# Patient Record
Sex: Female | Born: 2005 | Race: White | Hispanic: Yes | Marital: Single | Smoking: Never smoker
Health system: Southern US, Community
[De-identification: ages and names within clinical notes are randomized; demographics above are authoritative.]

## PROBLEM LIST (undated history)

## (undated) ENCOUNTER — Inpatient Hospital Stay (HOSPITAL_COMMUNITY): Payer: Self-pay

## (undated) DIAGNOSIS — Z789 Other specified health status: Secondary | ICD-10-CM

## (undated) DIAGNOSIS — D649 Anemia, unspecified: Secondary | ICD-10-CM

## (undated) HISTORY — DX: Other specified health status: Z78.9

## (undated) HISTORY — PX: NO PAST SURGERIES: SHX2092

---
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
## 2024-01-03 LAB — AFP, SERUM, OPEN SPINA BIFIDA
AFP MoM: 0.77
AFP Value: 27.9 ng/mL
Gest. Age on Collection Date: 16 wk
Maternal Age At EDD: 19.5 a
OSBR Risk 1 IN: 10000
Test Results:: NEGATIVE
Weight: 130 [lb_av]

## 2024-01-04 ENCOUNTER — Ambulatory Visit: Payer: Self-pay | Admitting: Obstetrics and Gynecology

## 2024-01-28 ENCOUNTER — Ambulatory Visit (INDEPENDENT_AMBULATORY_CARE_PROVIDER_SITE_OTHER): Admitting: Obstetrics and Gynecology

## 2024-01-28 ENCOUNTER — Encounter: Admitting: Physician Assistant

## 2024-01-28 VITALS — BP 112/71 | HR 61 | Wt 131.0 lb

## 2024-01-28 DIAGNOSIS — B3731 Acute candidiasis of vulva and vagina: Secondary | ICD-10-CM

## 2024-01-28 DIAGNOSIS — Z3482 Encounter for supervision of other normal pregnancy, second trimester: Secondary | ICD-10-CM | POA: Diagnosis not present

## 2024-01-28 DIAGNOSIS — Z3A20 20 weeks gestation of pregnancy: Secondary | ICD-10-CM | POA: Diagnosis not present

## 2024-01-28 DIAGNOSIS — Z3480 Encounter for supervision of other normal pregnancy, unspecified trimester: Secondary | ICD-10-CM

## 2024-01-28 MED ORDER — MICONAZOLE NITRATE 2 % VA CREA
1.0000 | TOPICAL_CREAM | Freq: Every day | VAGINAL | 0 refills | Status: AC
Start: 2024-01-28 — End: 2024-02-04

## 2024-01-28 NOTE — Progress Notes (Signed)
 Pt states she was recently tx for yeast, still having symptoms.

## 2024-01-28 NOTE — Progress Notes (Signed)
   PRENATAL VISIT NOTE  Subjective:  Sophia Green is a 18 y.o. G2P1001 at [redacted]w[redacted]d being seen today for ongoing prenatal care.  She is currently monitored for the following issues for this low-risk pregnancy and has Supervision of other normal pregnancy, antepartum and Short interval between pregnancies affecting pregnancy, antepartum on their problem list.  Patient reports doing well but was treated for yeast infection and symptoms improved but has returned with continued thick white vaginal discharge. Requesting refill on yeast meds.  Contractions: Not present. Vag. Bleeding: None.  Movement: Present. Denies leaking of fluid.   The following portions of the patient's history were reviewed and updated as appropriate: allergies, current medications, past family history, past medical history, past social history, past surgical history and problem list.   Objective:   Vitals:   01/28/24 1122  BP: 112/71  Pulse: 61  Weight: 131 lb (59.4 kg)   Body mass index is 25.58 kg/m. Total weight gain: -9 lb (-4.082 kg)   Fetal Status: Fetal Heart Rate (bpm): 140   Movement: Present     General:  Alert, oriented and cooperative. Patient is in no acute distress.  Skin: Skin is warm and dry. No rash noted.   Cardiovascular: Normal heart rate noted  Respiratory: Normal respiratory effort, no problems with respiration noted  Abdomen: Soft, gravid, appropriate for gestational age.  Pain/Pressure: Absent     Pelvic: Cervical exam deferred        Extremities: Normal range of motion.     Mental Status: Normal mood and affect. Normal behavior. Normal judgment and thought content.   Assessment and Plan:  Pregnancy: G2P1001 at [redacted]w[redacted]d 1. Supervision of other normal pregnancy, antepartum (Primary) Anticipatory guidance Anatomy ultrasound 10/10 Neg AFP  2. Vaginal yeast infection Refill given - miconazole  (MONISTAT  7) 2 % vaginal cream; Place 1 Applicatorful vaginally at bedtime for 7 days.   Dispense: 315 g; Refill: 0   Preterm labor symptoms and general obstetric precautions including but not limited to vaginal bleeding, contractions, leaking of fluid and fetal movement were reviewed in detail with the patient. Please refer to After Visit Summary for other counseling recommendations.   No follow-ups on file.  Future Appointments  Date Time Provider Department Center  02/12/2024  8:00 AM [facility] PROVIDER 1 [facility]  02/12/2024  8:30 AM [facility] US1 [facility]    Rollo ONEIDA Bring, MD

## 2024-02-12 ENCOUNTER — Other Ambulatory Visit: Payer: Self-pay | Admitting: Obstetrics and Gynecology

## 2024-02-12 ENCOUNTER — Ambulatory Visit: Attending: Obstetrics and Gynecology

## 2024-02-12 ENCOUNTER — Inpatient Hospital Stay (HOSPITAL_COMMUNITY)
Admission: AD | Admit: 2024-02-12 | Discharge: 2024-02-13 | Disposition: A | Discharge status: Home / Self Care | Attending: Obstetrics & Gynecology | Admitting: Obstetrics & Gynecology

## 2024-02-12 ENCOUNTER — Ambulatory Visit: Admitting: Maternal & Fetal Medicine

## 2024-02-12 VITALS — BP 111/70

## 2024-02-12 DIAGNOSIS — Z3689 Encounter for other specified antenatal screening: Secondary | ICD-10-CM | POA: Insufficient documentation

## 2024-02-12 DIAGNOSIS — R1030 Lower abdominal pain, unspecified: Secondary | ICD-10-CM | POA: Diagnosis not present

## 2024-02-12 DIAGNOSIS — Z3480 Encounter for supervision of other normal pregnancy, unspecified trimester: Secondary | ICD-10-CM

## 2024-02-12 DIAGNOSIS — O26852 Spotting complicating pregnancy, second trimester: Secondary | ICD-10-CM | POA: Diagnosis not present

## 2024-02-12 DIAGNOSIS — O99891 Other specified diseases and conditions complicating pregnancy: Secondary | ICD-10-CM | POA: Diagnosis not present

## 2024-02-12 DIAGNOSIS — N920 Excessive and frequent menstruation with regular cycle: Secondary | ICD-10-CM

## 2024-02-12 DIAGNOSIS — O09899 Supervision of other high risk pregnancies, unspecified trimester: Secondary | ICD-10-CM | POA: Diagnosis present

## 2024-02-12 DIAGNOSIS — R1031 Right lower quadrant pain: Secondary | ICD-10-CM | POA: Diagnosis not present

## 2024-02-12 DIAGNOSIS — O09892 Supervision of other high risk pregnancies, second trimester: Secondary | ICD-10-CM

## 2024-02-12 DIAGNOSIS — O26892 Other specified pregnancy related conditions, second trimester: Secondary | ICD-10-CM | POA: Insufficient documentation

## 2024-02-12 DIAGNOSIS — Z3A22 22 weeks gestation of pregnancy: Secondary | ICD-10-CM

## 2024-02-12 DIAGNOSIS — Z711 Person with feared health complaint in whom no diagnosis is made: Secondary | ICD-10-CM

## 2024-02-12 DIAGNOSIS — O3680X0 Pregnancy with inconclusive fetal viability, not applicable or unspecified: Secondary | ICD-10-CM

## 2024-02-12 DIAGNOSIS — Z363 Encounter for antenatal screening for malformations: Secondary | ICD-10-CM | POA: Diagnosis not present

## 2024-02-12 DIAGNOSIS — R1032 Left lower quadrant pain: Secondary | ICD-10-CM | POA: Diagnosis not present

## 2024-02-12 NOTE — MAU Note (Signed)
..  Sophia Green is a 18 y.o. at [redacted]w[redacted]d here in MAU reporting: cramping that began today around 3 pm and spotting that began around midday. The bleeding is only when she wipes and some spots on her underwear.  Last intercourse: 2 weeks ago Pain score: 6/10 Vitals:   02/12/24 2316  BP: 113/67  Pulse: 80  Resp: 15  Temp: 98.4 F (36.9 C)  SpO2: 100%     FHT:140 Lab orders placed from triage:  UA

## 2024-02-12 NOTE — Progress Notes (Signed)
 Patient information  Patient Name: Sophia Green  Patient MRN:   [id number]  Referring practice: [facility] Referring Provider: [facility] [facility]  Problem List   Patient Active Problem List   Diagnosis Date Noted   Supervision of other normal pregnancy, antepartum 11/17/2023   Short interval between pregnancies affecting pregnancy, antepartum 11/17/2023    Maternal Fetal Medicine Consult Sophia Green is a 18 y.o. G2P1001 at [redacted]w[redacted]d here for ultrasound and consultation. She had low risk aneuploidy screening of a female fetus. Carrier screening was Negative for the basic screening (SMA, alpha-thal, beta-thal, and cystic fibroisis. Maternal serum AFP was negative. She has no acute concerns.   Today we focused on the following:   Short interval pregnancy: I discussed the clinical implications of this diagnosis.  It appears that there will be approximately 16 months between conception of the previous pregnancy and delivery of this pregnancy.  This has been associated with an increased risk of preterm birth although the exact risk is unknown.  No future ultrasounds or interventions are required at this time.  The patient will monitor for signs and symptoms of preterm labor and notify her OB provider with any concerns.   Sonographic findings Single intrauterine pregnancy at 22w 1d. Fetal cardiac activity:  Observed and appears normal. Presentation: Transverse, head to maternal right. The anatomic structures that were well seen appear normal without evidence of soft markers. The anatomic survey is complete.  Fetal biometry shows the estimated fetal weight at the 21 percentile. Amniotic fluid: Within normal limits.  MVP: 5.55 cm. Placenta: Posterior Fundal. Adnexa: No adnexal mass visualized. Cervical length: 3.2 cm.  There are limitations of prenatal ultrasound such as the inability to detect certain abnormalities due to poor visualization. Various factors such as fetal  position, gestational age and maternal body habitus may increase the difficulty in visualizing the fetal anatomy.    Recommendations -EDD should be 06/16/2024 based on  LMP  (09/10/23). -No further ultrasounds are recommended at this time based on the current indications. If future indications arise (e.g. size/date discrepancy on fundal height, gestational diabetes or hypertension) and an ultrasound is to be desired at our MFM office, please send a referral.   Review of Systems: A review of systems was performed and was negative except per HPI   Past Obstetrical History:  OB History  Gravida Para Term Preterm AB Living  2 1 1  0 0 1  SAB IAB Ectopic Multiple Live Births  0 0 0 0 1    # Outcome Date GA Lbr Len/2nd Weight Sex Type Anes PTL Lv  2 Current           1 Term 12/21/22 [redacted]w[redacted]d 09:00 / 02:44 8 lb 1.8 oz (3.68 kg) M Vag-Spont EPI, Local  LIV     Birth Comments: wdl     Past Medical History:  Past Medical History:  Diagnosis Date   Anemia      Past Surgical History:    Past Surgical History:  Procedure Laterality Date   NO PAST SURGERIES       Home Medications:   Current Outpatient Medications on File Prior to Visit  Medication Sig Dispense Refill   ferrous sulfate  325 (65 FE) MG tablet Take 1 tablet (325 mg total) by mouth every other day. 30 tablet 3   Prenatal Vit-Fe Fumarate-FA (PRENATAL VITAMINS) 28-0.8 MG TABS Take 1 tablet by mouth daily. 30 tablet 11   No current facility-administered medications on file prior to visit.  Allergies:   No Known Allergies   Physical Exam:   Vitals:   02/12/24 0814  BP: 111/70   Sitting comfortably on the sonogram table Nonlabored breathing Normal rate and rhythm Abdomen is nontender  Thank you for the opportunity to be involved with this patient's care. Please let us  know if we can be of any further assistance.   30 minutes of time was spent reviewing the patient's chart including labs, imaging and documentation.   At least 50% of this time was spent with direct patient care discussing the diagnosis, management and prognosis of her care.  Delora Smaller MFM, [place]   02/12/2024  9:18 AM

## 2024-02-13 ENCOUNTER — Encounter (HOSPITAL_COMMUNITY): Payer: Self-pay | Admitting: Obstetrics & Gynecology

## 2024-02-13 DIAGNOSIS — N920 Excessive and frequent menstruation with regular cycle: Secondary | ICD-10-CM

## 2024-02-13 DIAGNOSIS — R1031 Right lower quadrant pain: Secondary | ICD-10-CM

## 2024-02-13 DIAGNOSIS — R1032 Left lower quadrant pain: Secondary | ICD-10-CM

## 2024-02-13 DIAGNOSIS — Z3A22 22 weeks gestation of pregnancy: Secondary | ICD-10-CM

## 2024-02-13 DIAGNOSIS — O99891 Other specified diseases and conditions complicating pregnancy: Secondary | ICD-10-CM

## 2024-02-13 LAB — URINALYSIS, ROUTINE W REFLEX MICROSCOPIC
Bacteria, UA: NONE SEEN
Bilirubin Urine: NEGATIVE
Glucose, UA: NEGATIVE mg/dL
Hgb urine dipstick: NEGATIVE
Ketones, ur: NEGATIVE mg/dL
Nitrite: NEGATIVE
Protein, ur: NEGATIVE mg/dL
Specific Gravity, Urine: 1.029 (ref 1.005–1.030)
pH: 5 (ref 5.0–8.0)

## 2024-02-13 LAB — WET PREP, GENITAL
Clue Cells Wet Prep HPF POC: NONE SEEN
Sperm: NONE SEEN
Trich, Wet Prep: NONE SEEN
WBC, Wet Prep HPF POC: >=10 — AB (ref <10)
Yeast Wet Prep HPF POC: NONE SEEN

## 2024-02-13 NOTE — MAU Provider Note (Signed)
 History     CSN: [id number]  Arrival date and time: 02/12/24 2240   Event Date/Time   First Provider Initiated Contact with Patient 02/13/24 0052      Chief Complaint  Patient presents with   Abdominal Pain   Vaginal Bleeding   Sophia Green , a  18 y.o. G2P1001 at [redacted]w[redacted]d presents to MAU with complaints of spotting and lower abdominal cramping. Patient reports spotting 2-3 days ago. She reports that it was only a little bit with wiping. Denies bright red bleeding and passing clots. Denies bleeding or spotting at this time. She also reports some intermittent lower abdominal pain that started today. She currently rates pain as a 6/10 and denies attempting to relieve symptoms. Denies worsening or alleviating symptoms. She also reports a yellow vaginal discharge. Denies a odor or itching/burning and urinary symptoms. Endorses positive fetal movement.          OB History     Gravida  2   Para  1   Term  1   Preterm  0   AB  0   Living  1      SAB  0   IAB  0   Ectopic  0   Multiple  0   Live Births  1           Past Medical History:  Diagnosis Date   Anemia     Past Surgical History:  Procedure Laterality Date   NO PAST SURGERIES      Family History  Problem Relation Age of Onset   Healthy Mother    Healthy Father    Diabetes Maternal Aunt    Diabetes Maternal Grandmother    Asthma Neg Hx    Cancer Neg Hx    Heart disease Neg Hx    Hypertension Neg Hx     Social History   Tobacco Use   Smoking status: Never   Smokeless tobacco: Never  Vaping Use   Vaping status: Never Used  Substance Use Topics   Alcohol use: Never   Drug use: Never    Allergies: No Known Allergies  Medications Prior to Admission  Medication Sig Dispense Refill Last Dose/Taking   Prenatal Vit-Fe Fumarate-FA (PRENATAL VITAMINS) 28-0.8 MG TABS Take 1 tablet by mouth daily. 30 tablet 11 02/12/2024   ferrous sulfate  325 (65 FE) MG tablet Take 1 tablet  (325 mg total) by mouth every other day. (Patient not taking: Reported on 02/13/2024) 30 tablet 3 Not Taking    Review of Systems  Constitutional:  Negative for chills, fatigue and fever.  Eyes:  Negative for pain and visual disturbance.  Respiratory:  Negative for apnea, shortness of breath and wheezing.   Cardiovascular:  Negative for chest pain and palpitations.  Gastrointestinal:  Positive for abdominal pain. Negative for constipation, diarrhea, nausea and vomiting.  Genitourinary:  Positive for vaginal discharge. Negative for difficulty urinating, dysuria, pelvic pain, vaginal bleeding and vaginal pain.  Musculoskeletal:  Negative for back pain.  Neurological:  Negative for seizures, weakness and headaches.  Psychiatric/Behavioral:  Negative for suicidal ideas.    Physical Exam   Blood pressure 113/67, pulse 80, temperature 98.4 F (36.9 C), temperature source Oral, resp. rate 15, height 5' (1.524 m), weight 60.9 kg, last menstrual period 09/10/2023, SpO2 100%, currently breastfeeding.  Physical Exam Vitals and nursing note reviewed. Exam conducted with a chaperone present BENNIE. [place] RN).  Constitutional:      General: She is not in acute distress.  Appearance: Normal appearance.  HENT:     Head: Normocephalic.  Pulmonary:     Effort: Pulmonary effort is normal.  Abdominal:     Tenderness: There is no abdominal tenderness. There is no guarding.  Genitourinary:    Vagina: Vaginal discharge present.     Cervix: Discharge present.     Uterus: Not tender.      Comments: Frothy yellow discharge noted.  Musculoskeletal:     Cervical back: Normal range of motion.  Skin:    General: Skin is warm and dry.  Neurological:     Mental Status: She is alert and oriented to person, place, and time.  Psychiatric:        Mood and Affect: Mood normal.    FHT obtained in triage    MAU Course  Procedures Orders Placed This Encounter  Procedures   Wet prep, genital    Urinalysis, Routine w reflex microscopic -Urine, Clean Catch   Results for orders placed or performed during the hospital encounter of 02/12/24 (from the past 24 hours)  Urinalysis, Routine w reflex microscopic -Urine, Clean Catch     Status: Abnormal   Collection Time: 02/12/24 11:10 PM  Result Value Ref Range   Color, Urine YELLOW YELLOW   APPearance HAZY (A) CLEAR   Specific Gravity, Urine 1.029 1.005 - 1.030   pH 5.0 5.0 - 8.0   Glucose, UA NEGATIVE NEGATIVE mg/dL   Hgb urine dipstick NEGATIVE NEGATIVE   Bilirubin Urine NEGATIVE NEGATIVE   Ketones, ur NEGATIVE NEGATIVE mg/dL   Protein, ur NEGATIVE NEGATIVE mg/dL   Nitrite NEGATIVE NEGATIVE   Leukocytes,Ua SMALL (A) NEGATIVE   RBC / HPF 0-5 0 - 5 RBC/hpf   WBC, UA 0-5 0 - 5 WBC/hpf   Bacteria, UA NONE SEEN NONE SEEN   Squamous Epithelial / HPF 6-10 0 - 5 /HPF   Mucus PRESENT     MDM - Offered patient pain management and patient declined. Heat packs given.  - Through conversation patient also denies drinking any water in the last few days. Discussed the importance of drinking water.  - UA notable for small leuks, and hazy in appearance, otherwise normal. Low suspicion for UTI in pregnancy.  - PO hydration encouraged.  - Wet prep normal  - No signs of spotting or bleeding noted on exam. Cervix appears closed, low suspicion for PTL.  - plan for discharge.   Assessment and Plan   1. Pregnancy related bilateral lower abdominal cramping, antepartum   2. Spotting   3. [redacted] weeks gestation of pregnancy   4. Physically well but worried    - Reviewed that cramping and spotting can be a normal discomfort of pregnancy.  - Reviewed worsening signs and symptoms and when to return to MAU.  - Strong recommendation to drink water and discussed comfort measures that are safe in pregnancy.  - Patient discharged home in stable condition and may return to MAU as needed.    Claris CHRISTELLA Cedar, MSN CNM  02/13/2024, 12:52 AM

## 2024-02-15 LAB — GC/CHLAMYDIA PROBE AMP (~~LOC~~) NOT AT ARMC
Chlamydia: NEGATIVE
Comment: NEGATIVE
Comment: NORMAL
Neisseria Gonorrhea: NEGATIVE

## 2024-02-16 ENCOUNTER — Ambulatory Visit: Payer: Self-pay | Admitting: Obstetrics and Gynecology

## 2024-02-16 DIAGNOSIS — Z3480 Encounter for supervision of other normal pregnancy, unspecified trimester: Secondary | ICD-10-CM

## 2024-02-24 ENCOUNTER — Encounter (HOSPITAL_COMMUNITY): Payer: Self-pay | Admitting: Obstetrics and Gynecology

## 2024-02-24 ENCOUNTER — Inpatient Hospital Stay (HOSPITAL_COMMUNITY)

## 2024-02-24 ENCOUNTER — Inpatient Hospital Stay (HOSPITAL_COMMUNITY)
Admission: AD | Admit: 2024-02-24 | Discharge: 2024-02-24 | Disposition: A | Discharge status: Home / Self Care | Attending: Obstetrics and Gynecology | Admitting: Obstetrics and Gynecology

## 2024-02-24 DIAGNOSIS — O26892 Other specified pregnancy related conditions, second trimester: Secondary | ICD-10-CM | POA: Diagnosis present

## 2024-02-24 DIAGNOSIS — R1011 Right upper quadrant pain: Secondary | ICD-10-CM | POA: Insufficient documentation

## 2024-02-24 DIAGNOSIS — O212 Late vomiting of pregnancy: Secondary | ICD-10-CM | POA: Insufficient documentation

## 2024-02-24 DIAGNOSIS — O26899 Other specified pregnancy related conditions, unspecified trimester: Secondary | ICD-10-CM

## 2024-02-24 DIAGNOSIS — Z3A23 23 weeks gestation of pregnancy: Secondary | ICD-10-CM

## 2024-02-24 DIAGNOSIS — R109 Unspecified abdominal pain: Secondary | ICD-10-CM

## 2024-02-24 DIAGNOSIS — M549 Dorsalgia, unspecified: Secondary | ICD-10-CM | POA: Diagnosis not present

## 2024-02-24 DIAGNOSIS — O219 Vomiting of pregnancy, unspecified: Secondary | ICD-10-CM

## 2024-02-24 LAB — COMPREHENSIVE METABOLIC PANEL WITH GFR
ALT: 13 U/L (ref 0–44)
AST: 18 U/L (ref 15–41)
Albumin: 2.9 g/dL — ABNORMAL LOW (ref 3.5–5.0)
Alkaline Phosphatase: 69 U/L (ref 38–126)
Anion gap: 8 (ref 5–15)
BUN: 6 mg/dL (ref 6–20)
CO2: 20 mmol/L — ABNORMAL LOW (ref 22–32)
Calcium: 8.4 mg/dL — ABNORMAL LOW (ref 8.9–10.3)
Chloride: 108 mmol/L (ref 98–111)
Creatinine, Ser: 0.47 mg/dL (ref 0.44–1.00)
GFR, Estimated: >60 mL/min (ref >60)
Glucose, Bld: 92 mg/dL (ref 70–99)
Potassium: 3.7 mmol/L (ref 3.5–5.1)
Sodium: 136 mmol/L (ref 135–145)
Total Bilirubin: 0.3 mg/dL (ref 0.0–1.2)
Total Protein: 6 g/dL — ABNORMAL LOW (ref 6.5–8.1)

## 2024-02-24 LAB — CBC
HCT: 31.5 % — ABNORMAL LOW (ref 36.0–46.0)
Hemoglobin: 10.8 g/dL — ABNORMAL LOW (ref 12.0–15.0)
MCH: 30.3 pg (ref 26.0–34.0)
MCHC: 34.3 g/dL (ref 30.0–36.0)
MCV: 88.2 fL (ref 80.0–100.0)
Platelets: 162 K/uL (ref 150–400)
RBC: 3.57 MIL/uL — ABNORMAL LOW (ref 3.87–5.11)
RDW: 13.9 % (ref 11.5–15.5)
WBC: 8.4 K/uL (ref 4.0–10.5)
nRBC: 0 % (ref 0.0–0.2)

## 2024-02-24 LAB — URINALYSIS, ROUTINE W REFLEX MICROSCOPIC
Bacteria, UA: NONE SEEN
Bilirubin Urine: NEGATIVE
Glucose, UA: NEGATIVE mg/dL
Hgb urine dipstick: NEGATIVE
Ketones, ur: NEGATIVE mg/dL
Nitrite: NEGATIVE
Protein, ur: NEGATIVE mg/dL
Specific Gravity, Urine: 1.017 (ref 1.005–1.030)
pH: 5 (ref 5.0–8.0)

## 2024-02-24 LAB — LIPASE, BLOOD: Lipase: 23 U/L (ref 11–51)

## 2024-02-24 MED ORDER — ONDANSETRON 4 MG PO TBDP
8.0000 mg | ORAL_TABLET | Freq: Once | ORAL | Status: AC
  Administered 2024-02-24: 8 mg via ORAL
  Filled 2024-02-24: qty 2

## 2024-02-24 MED ORDER — METOCLOPRAMIDE HCL 5 MG/ML IJ SOLN
10.0000 mg | Freq: Once | INTRAMUSCULAR | Status: AC
  Administered 2024-02-24: 10 mg via INTRAVENOUS
  Filled 2024-02-24: qty 2

## 2024-02-24 MED ORDER — LIDOCAINE 5 % EX PTCH
1.0000 | MEDICATED_PATCH | CUTANEOUS | Status: DC
  Administered 2024-02-24: 1 via TRANSDERMAL
  Filled 2024-02-24 (×2): qty 1

## 2024-02-24 MED ORDER — FAMOTIDINE 20 MG PO TABS: 20.0000 mg | ORAL_TABLET | Freq: Two times a day (BID) | ORAL | 0 refills | Status: AC | PRN

## 2024-02-24 MED ORDER — METOCLOPRAMIDE HCL 10 MG PO TABS: 10.0000 mg | ORAL_TABLET | Freq: Four times a day (QID) | ORAL | 0 refills | Status: AC | PRN

## 2024-02-24 MED ORDER — ACETAMINOPHEN 500 MG PO TABS
1000.0000 mg | ORAL_TABLET | Freq: Once | ORAL | Status: AC
  Administered 2024-02-24: 1000 mg via ORAL
  Filled 2024-02-24: qty 2

## 2024-02-24 MED ORDER — FAMOTIDINE IN NACL 20-0.9 MG/50ML-% IV SOLN
20.0000 mg | Freq: Once | INTRAVENOUS | Status: AC
  Administered 2024-02-24: 20 mg via INTRAVENOUS
  Filled 2024-02-24: qty 50

## 2024-02-24 MED ORDER — ALUM & MAG HYDROXIDE-SIMETH 200-200-20 MG/5ML PO SUSP
30.0000 mL | Freq: Once | ORAL | Status: AC
  Administered 2024-02-24: 30 mL via ORAL
  Filled 2024-02-24: qty 30

## 2024-02-24 MED ORDER — LACTATED RINGERS IV BOLUS
1000.0000 mL | Freq: Once | INTRAVENOUS | Status: AC
  Administered 2024-02-24: 1000 mL via INTRAVENOUS

## 2024-02-24 NOTE — MAU Note (Signed)
 Pt vomiting - states she does not want medications for nausea. MD made aware.

## 2024-02-24 NOTE — MAU Note (Addendum)
 RN called lab to inquire about Comprehensive Metabolic Panel and Lipase. Lab tech answered and informed RN that they are in process and should be resulted in 15-20 minutes.

## 2024-02-24 NOTE — MAU Note (Signed)
 Sophia Green is a 18 y.o. at [redacted]w[redacted]d here in MAU reporting having a hollow, dense pain in her abdomen. She motions to all over her stomach for pain location. Denies VB or LOF. Feels baby moving. Also having some lower back pain. The pain woke her up this am  LMP: na Onset of complaint: 0330 Pain score: 8 Vitals:   02/24/24 0437  BP: 120/77  Pulse: 73  Resp: 16  Temp: 97.6 F (36.4 C)  SpO2: 100%     FHT: 134  Lab orders placed from triage: u/a

## 2024-02-24 NOTE — MAU Provider Note (Addendum)
 Chief Complaint:  Abdominal Pain and Back Pain   HPI     Sophia Green is a 18 y.o. G2P1001 at [redacted]w[redacted]d who presents to maternity admissions reporting upper abdominal pain since 3:30am. Pain woke her up from sleep and has been present since then. Has not tried any medications for the pain. Currently 8/10 on the pain scale. Describes the pain as sharp and going into her back. Works at [employer], which she had for dinner. Pain is worse when taking a deep breath. Denies headache, changes in vision, trouble swallowing, acidic taste in mouth, nausea, vomiting, diarrhea, vaginal bleeding or discharge. She denies any surgical history.  Pregnancy Course: Pregnancy has been uncomplicated.   Past Medical History:  Diagnosis Date   Anemia    OB History  Gravida Para Term Preterm AB Living  2 1 1  0 0 1  SAB IAB Ectopic Multiple Live Births  0 0 0 0 1    # Outcome Date GA Lbr Len/2nd Weight Sex Type Anes PTL Lv  2 Current           1 Term 12/21/22 [redacted]w[redacted]d 09:00 / 02:44 3680 g M Vag-Spont EPI, Local  LIV     Birth Comments: wdl   Past Surgical History:  Procedure Laterality Date   NO PAST SURGERIES     Family History  Problem Relation Age of Onset   Healthy Mother    Healthy Father    Diabetes Maternal Aunt    Diabetes Maternal Grandmother    Asthma Neg Hx    Cancer Neg Hx    Heart disease Neg Hx    Hypertension Neg Hx    Social History   Tobacco Use   Smoking status: Never   Smokeless tobacco: Never  Vaping Use   Vaping status: Never Used  Substance Use Topics   Alcohol use: Never   Drug use: Never   No Known Allergies Medications Prior to Admission  Medication Sig Dispense Refill Last Dose/Taking   Prenatal Vit-Fe Fumarate-FA (PRENATAL VITAMINS) 28-0.8 MG TABS Take 1 tablet by mouth daily. 30 tablet 11 02/23/2024   ferrous sulfate  325 (65 FE) MG tablet Take 1 tablet (325 mg total) by mouth every other day. (Patient not taking: Reported on 02/13/2024) 30 tablet 3      I have reviewed patient's Past Medical Hx, Surgical Hx, Family Hx, Social Hx, medications and allergies.   ROS  Pertinent items noted in HPI and remainder of comprehensive ROS otherwise negative.   PHYSICAL EXAM  Patient Vitals for the past 24 hrs:  BP Temp Pulse Resp SpO2 Height Weight  02/24/24 0546 120/85 -- 78 -- -- -- --  02/24/24 0532 (!) 125/90 -- 97 -- 100 % -- --  02/24/24 0437 120/77 97.6 F (36.4 C) 73 16 100 % 5' 1 (1.549 m) 62.1 kg    Constitutional: Well-developed, well-nourished female in no acute distress.  Cardiovascular: Warm and well-perfused Respiratory: normal effort, no problems with respiration noted GI: Abd soft, non-distended, TTP in epigastric, LUQ and RUQ regions MS: Extremities nontender, no edema, normal ROM Neurologic: Alert and oriented x 4.  GU: no CVA tenderness Pelvic: deferred     Fetal Tracing: Baseline: 130 bpm Variability: moderate Accelerations: present Decelerations: absent Toco: flat   Labs: Results for orders placed or performed during the hospital encounter of 02/24/24 (from the past 24 hours)  Urinalysis, Routine w reflex microscopic -Urine, Clean Catch     Status: Abnormal   Collection Time: 02/24/24  5:00  AM  Result Value Ref Range   Color, Urine YELLOW YELLOW   APPearance HAZY (A) CLEAR   Specific Gravity, Urine 1.017 1.005 - 1.030   pH 5.0 5.0 - 8.0   Glucose, UA NEGATIVE NEGATIVE mg/dL   Hgb urine dipstick NEGATIVE NEGATIVE   Bilirubin Urine NEGATIVE NEGATIVE   Ketones, ur NEGATIVE NEGATIVE mg/dL   Protein, ur NEGATIVE NEGATIVE mg/dL   Nitrite NEGATIVE NEGATIVE   Leukocytes,Ua TRACE (A) NEGATIVE   RBC / HPF 0-5 0 - 5 RBC/hpf   WBC, UA 0-5 0 - 5 WBC/hpf   Bacteria, UA NONE SEEN NONE SEEN   Squamous Epithelial / HPF 6-10 0 - 5 /HPF   Mucus PRESENT   CBC     Status: Abnormal   Collection Time: 02/24/24  5:44 AM  Result Value Ref Range   WBC 8.4 4.0 - 10.5 K/uL   RBC 3.57 (L) 3.87 - 5.11 MIL/uL   Hemoglobin  10.8 (L) 12.0 - 15.0 g/dL   HCT 68.4 (L) 63.9 - 53.9 %   MCV 88.2 80.0 - 100.0 fL   MCH 30.3 26.0 - 34.0 pg   MCHC 34.3 30.0 - 36.0 g/dL   RDW 86.0 88.4 - 84.4 %   Platelets 162 150 - 400 K/uL   nRBC 0.0 0.0 - 0.2 %  Comprehensive metabolic panel with GFR     Status: Abnormal   Collection Time: 02/24/24  5:44 AM  Result Value Ref Range   Sodium 136 135 - 145 mmol/L   Potassium 3.7 3.5 - 5.1 mmol/L   Chloride 108 98 - 111 mmol/L   CO2 20 (L) 22 - 32 mmol/L   Glucose, Bld 92 70 - 99 mg/dL   BUN 6 6 - 20 mg/dL   Creatinine, Ser 9.52 0.44 - 1.00 mg/dL   Calcium 8.4 (L) 8.9 - 10.3 mg/dL   Total Protein 6.0 (L) 6.5 - 8.1 g/dL   Albumin 2.9 (L) 3.5 - 5.0 g/dL   AST 18 15 - 41 U/L   ALT 13 0 - 44 U/L   Alkaline Phosphatase 69 38 - 126 U/L   Total Bilirubin 0.3 0.0 - 1.2 mg/dL   GFR, Estimated >39 >39 mL/min   Anion gap 8 5 - 15  Lipase, blood     Status: None   Collection Time: 02/24/24  5:44 AM  Result Value Ref Range   Lipase 23 11 - 51 U/L    Imaging:  US  ABDOMEN LIMITED RUQ (LIVER/GB) Result Date: 02/24/2024 EXAM: Right Upper Quadrant Abdominal Ultrasound TECHNIQUE: Real-time ultrasonography of the right upper quadrant of the abdomen was performed. COMPARISON: None. CLINICAL HISTORY: [redacted] weeks gestation of pregnancy; RUQ pain. FINDINGS: LIVER: The liver demonstrates normal echogenicity. No intrahepatic biliary ductal dilatation. No mass. Portal vein appears patent with normal flow towards the liver. BILIARY SYSTEM: Gallbladder appears normal. No gallstones, sludge, or pericholecystic fluid. Negative sonographic Murphy sign. No gallbladder wall thickening. No bile duct dilatation. The common bile duct measures 2.7 mm. OTHER: No right upper quadrant ascites. IMPRESSION: 1. Unremarkable right upper quadrant ultrasound. Electronically signed by: Waddell Calk MD 02/24/2024 06:02 AM EDT RP Workstation: [id number]    MDM & MAU COURSE  MDM: Moderate  MAU Course: Orders Placed This  Encounter  Procedures   US  ABDOMEN LIMITED RUQ (LIVER/GB)   Urinalysis, Routine w reflex microscopic -Urine, Clean Catch   CBC   Comprehensive metabolic panel with GFR   Lipase, blood   Discharge patient Discharge disposition: 01-Home or  Self Care; Discharge patient date: 02/24/2024   Meds ordered this encounter  Medications   acetaminophen  (TYLENOL ) tablet 1,000 mg   lidocaine  (LIDODERM ) 5 % 1 patch   ondansetron  (ZOFRAN -ODT) disintegrating tablet 8 mg   acetaminophen  (TYLENOL ) tablet 1,000 mg   alum & mag hydroxide-simeth (MAALOX/MYLANTA) 200-200-20 MG/5ML suspension 30 mL   lactated ringers  bolus 1,000 mL   famotidine (PEPCID) IVPB 20 mg premix   metoCLOPramide (REGLAN) injection 10 mg   metoCLOPramide (REGLAN) 10 MG tablet    Sig: Take 1 tablet (10 mg total) by mouth every 6 (six) hours as needed.    Dispense:  30 tablet    Refill:  0    Supervising Provider:   PRATT, TANYA S [[id number]]   famotidine (PEPCID) 20 MG tablet    Sig: Take 1 tablet (20 mg total) by mouth 2 (two) times daily as needed for heartburn or indigestion.    Dispense:  60 tablet    Refill:  0    Supervising Provider:   PRATT, TANYA S [[id number]]   VSS. Exam showing abdominal tenderness in upper quadrants, concern for gallbladder or pancreas pathology. Will give patient tylenol  for pain management and proceed with labwork.  5:25am - Vomited within 20 minutes of receiving tylenol , declines antiemetics at this time. Believes she feels slightly better. 6a - RUQ negative for gallbladder or liver pathology. Discussed results with patient. Offered pain medication and antiemetics, declined. Would like lidocaine  patch for her back.  7a - Pain increased to 10/10 per patient. Lidocaine  patch helping with back pain. Open to tylenol  and zofran  at this time. Still waiting for lipase and CMP results. 7:30a - Lipase and CMP overall unremarkable. Will give PO medications and observe for improvement. Current differential includes  gastritis, foodborne illness, appendicitis. If no improvement with rest and medications, will consider IV medications and MRI abdomen for better visualization. Patient signed out to Olam Dalton NP at [phone].    Charlie Courts, MD  Family Medicine - Obstetrics Fellow  ------------------------------------------------------------------------------------------------------- I resumed care of this patient at [address] Dinesh Ulysse, NP [phone]- patient reassessed by me and still c/o upper abdominal pain, although abdomen is soft, No guarding, no rigidity, no rebound on palpation. Will hold MRI for now and treat with LR Bolus, IV Reglan and IV Pepcid for likely gastroenteritis. Patient also reported unable to tolerate PO Challenge offered prior and still feels nauseated.  - NST reactive for GA @ [redacted]w[redacted]d [id number]: Patient reassessed after medication and IVF administered and she is now reporting 0/10 pain in abdomen. Will start PO Challenge and plan for discharge with discharge RX's for Pepcid and Reglan listed on AVS   ASSESSMENT   1. Abdominal pain affecting pregnancy   2. [redacted] weeks gestation of pregnancy   3. Nausea and vomiting during pregnancy     PLAN   Future Appointments  Date Time Provider Department Center  02/25/2024 10:15 AM Delores Nidia CROME, FNP [facility] None   Discharge from MAU in stable condition  See AVS for full description of educational information and instructions provided to the patient at time of discharge  Warning signs for worsening condition that would warrant emergency follow-up discussed Patient may return to MAU as needed   Allergies as of 02/24/2024   No Known Allergies      Medication List     TAKE these medications    famotidine 20 MG tablet Commonly known as: Pepcid Take 1 tablet (20 mg total) by mouth 2 (two) times daily  as needed for heartburn or indigestion.   ferrous sulfate  325 (65 FE) MG tablet Take 1 tablet (325 mg total) by mouth every other day.    metoCLOPramide 10 MG tablet Commonly known as: REGLAN Take 1 tablet (10 mg total) by mouth every 6 (six) hours as needed.   Prenatal Vitamins 28-0.8 MG Tabs Take 1 tablet by mouth daily.        -------------------------------------------------------------------------------------------------------------------  Olam Dalton, MSN, [facility], [facility]   This chart was dictated using voice recognition software, Dragon. Despite the best efforts of this provider to proofread and correct errors, errors may still occur which can change documentation meaning.

## 2024-02-25 ENCOUNTER — Encounter: Payer: Self-pay | Admitting: Obstetrics and Gynecology

## 2024-02-25 ENCOUNTER — Ambulatory Visit: Admitting: Obstetrics and Gynecology

## 2024-02-25 VITALS — BP 111/67 | HR 91 | Wt 135.2 lb

## 2024-02-25 DIAGNOSIS — Z3A24 24 weeks gestation of pregnancy: Secondary | ICD-10-CM

## 2024-02-25 DIAGNOSIS — Z3480 Encounter for supervision of other normal pregnancy, unspecified trimester: Secondary | ICD-10-CM

## 2024-02-25 NOTE — Progress Notes (Signed)
 Pt presents for ROB visit. No concerns

## 2024-02-25 NOTE — Progress Notes (Signed)
   PRENATAL VISIT NOTE  Subjective:  Sophia Green is a 18 y.o. G2P1001 at [redacted]w[redacted]d being seen today for ongoing prenatal care.  She is currently monitored for the following issues for this low-risk pregnancy and has Supervision of other normal pregnancy, antepartum and Short interval between pregnancies affecting pregnancy, antepartum on their problem list.  Patient reports desires work note for her scheduled shift. Working all over the place early monring, or late shift then working early AM shift, employer will accommodate with note  Contractions: Not present. Vag. Bleeding: None.  Movement: Present. Denies leaking of fluid.   The following portions of the patient's history were reviewed and updated as appropriate: allergies, current medications, past family history, past medical history, past social history, past surgical history and problem list.   Objective:    Vitals:   02/25/24 1020  BP: 111/67  Pulse: 91  Weight: 135 lb 3.2 oz (61.3 kg)    Fetal Status:  Fetal Heart Rate (bpm): 141   Movement: Present    General: Alert, oriented and cooperative. Patient is in no acute distress.  Skin: Skin is warm and dry. No rash noted.   Cardiovascular: Normal heart rate noted  Respiratory: Normal respiratory effort, no problems with respiration noted  Abdomen: Soft, gravid, appropriate for gestational age.  Pain/Pressure: Absent     Pelvic: Cervical exam deferred        Extremities: Normal range of motion.  Edema: Trace  Mental Status: Normal mood and affect. Normal behavior. Normal judgment and thought content.   Assessment and Plan:  Pregnancy: G2P1001 at [redacted]w[redacted]d 1. Supervision of other normal pregnancy, antepartum (Primary) BP and FHR normal Doing well, feeling regular movement    2. [redacted] weeks gestation of pregnancy Anticipatory guidance regarding GTT and labs next visit, discussed NPO status after midnight   -work note provided  -doing better since leaving hospital  yesterday  -discussed tdap for next visit   Preterm labor symptoms and general obstetric precautions including but not limited to vaginal bleeding, contractions, leaking of fluid and fetal movement were reviewed in detail with the patient. Please refer to After Visit Summary for other counseling recommendations.   Return in about 4 weeks (around 03/24/2024) for OB VISIT (MD or APP), 2 hr GTT.   Nidia Daring, FNP

## 2024-03-24 ENCOUNTER — Other Ambulatory Visit

## 2024-03-24 ENCOUNTER — Ambulatory Visit (INDEPENDENT_AMBULATORY_CARE_PROVIDER_SITE_OTHER): Payer: Self-pay | Admitting: Physician Assistant

## 2024-03-24 VITALS — BP 108/73 | HR 76 | Wt 142.3 lb

## 2024-03-24 DIAGNOSIS — Z3A28 28 weeks gestation of pregnancy: Secondary | ICD-10-CM | POA: Diagnosis not present

## 2024-03-24 DIAGNOSIS — Z3483 Encounter for supervision of other normal pregnancy, third trimester: Secondary | ICD-10-CM | POA: Diagnosis not present

## 2024-03-24 DIAGNOSIS — Z3480 Encounter for supervision of other normal pregnancy, unspecified trimester: Secondary | ICD-10-CM

## 2024-03-24 NOTE — Progress Notes (Signed)
 Pt presents for rob. Pt has no questions or concerns at this time.

## 2024-03-24 NOTE — Progress Notes (Signed)
   PRENATAL VISIT NOTE  Subjective:  Sophia Green is a 18 y.o. G2P1001 at [redacted]w[redacted]d being seen today for ongoing prenatal care.  She is currently monitored for the following issues for this low-risk pregnancy and has Supervision of other normal pregnancy, antepartum and Short interval between pregnancies affecting pregnancy, antepartum on their problem list.  Patient reports no complaints.  Contractions: Irritability. Vag. Bleeding: None.  Movement: Present. Denies leaking of fluid.   The following portions of the patient's history were reviewed and updated as appropriate: allergies, current medications, past family history, past medical history, past social history, past surgical history and problem list.   Objective:   Vitals:   03/24/24 0842  BP: 108/73  Pulse: 76  Weight: 142 lb 4.8 oz (64.5 kg)    Fetal Status:  Fetal Heart Rate (bpm): 138 Fundal Height: 28 cm Movement: Present    General: Alert, oriented and cooperative. Patient is in no acute distress.  Skin: Skin is warm and dry. No rash noted.   Cardiovascular: Normal heart rate noted  Respiratory: Normal respiratory effort, no problems with respiration noted  Abdomen: Soft, gravid, appropriate for gestational age.  Pain/Pressure: Absent     Pelvic: Cervical exam deferred        Extremities: Normal range of motion.  Edema: Trace (hands)  Mental Status: Normal mood and affect. Normal behavior. Normal judgment and thought content.      03/24/2024    8:47 AM 12/03/2023   11:18 AM 11/17/2023    8:34 AM  Depression screen PHQ 2/9  Decreased Interest  1 0  Down, Depressed, Hopeless  0 0  PHQ - 2 Score  1 0  Altered sleeping 0 0 0  Tired, decreased energy 0 1 0  Change in appetite 0 1 0  Feeling bad or failure about yourself  0 0 0  Trouble concentrating 0 0 0  Moving slowly or fidgety/restless 0 0 0  Suicidal thoughts 0 0 0  PHQ-9 Score  3  0      Data saved with a previous flowsheet row definition         03/24/2024    8:47 AM 12/03/2023   11:18 AM 11/17/2023    8:35 AM 10/22/2022    8:23 AM  GAD 7 : Generalized Anxiety Score  Nervous, Anxious, on Edge 0 0 0 0  Control/stop worrying 0 1 0 0  Worry too much - different things 0 1 0 0  Trouble relaxing 0 0 0 0  Restless 0 0 0 0  Easily annoyed or irritable 0 0 0 0  Afraid - awful might happen 0 1 0 0  Total GAD 7 Score 0 3 0 0    Assessment and Plan:  Pregnancy: G2P1001 at [redacted]w[redacted]d  1. Supervision of other normal pregnancy, antepartum (Primary) Patient doing well, feeling regular fetal movement BP, FHR, FH appropriate   2. [redacted] weeks gestation of pregnancy Anticipatory guidance about next visits/weeks of pregnancy given.  28 week labs and Tdap today  Preterm labor symptoms and general obstetric precautions including but not limited to vaginal bleeding, contractions, leaking of fluid and fetal movement were reviewed in detail with the patient.  Please refer to After Visit Summary for other counseling recommendations.   Return in about 2 weeks (around 04/07/2024) for LOB.  No future appointments.   Alayasia Breeding E Dana Dorner, PA-C

## 2024-03-25 LAB — HIV ANTIBODY (ROUTINE TESTING W REFLEX): HIV Screen 4th Generation wRfx: NONREACTIVE

## 2024-03-25 LAB — GLUCOSE TOLERANCE, 2 HOURS W/ 1HR
Glucose, 1 hour: 90 mg/dL (ref 70–179)
Glucose, 2 hour: 76 mg/dL (ref 70–152)
Glucose, Fasting: 75 mg/dL (ref 70–91)

## 2024-03-25 LAB — CBC
Hematocrit: 32.2 % — ABNORMAL LOW (ref 34.0–46.6)
Hemoglobin: 10.2 g/dL — ABNORMAL LOW (ref 11.1–15.9)
MCH: 28.7 pg (ref 26.6–33.0)
MCHC: 31.7 g/dL (ref 31.5–35.7)
MCV: 91 fL (ref 79–97)
Platelets: 170 x10E3/uL (ref 150–450)
RBC: 3.55 x10E6/uL — ABNORMAL LOW (ref 3.77–5.28)
RDW: 13.4 % (ref 11.7–15.4)
WBC: 6.3 x10E3/uL (ref 3.4–10.8)

## 2024-03-25 LAB — SYPHILIS: RPR W/REFLEX TO RPR TITER AND TREPONEMAL ANTIBODIES, TRADITIONAL SCREENING AND DIAGNOSIS ALGORITHM: RPR Ser Ql: NONREACTIVE

## 2024-03-27 ENCOUNTER — Ambulatory Visit: Payer: Self-pay | Admitting: Physician Assistant

## 2024-04-11 ENCOUNTER — Ambulatory Visit: Admitting: Obstetrics and Gynecology

## 2024-04-11 VITALS — BP 109/72 | HR 85 | Wt 152.0 lb

## 2024-04-11 DIAGNOSIS — Z3A30 30 weeks gestation of pregnancy: Secondary | ICD-10-CM

## 2024-04-11 DIAGNOSIS — O09899 Supervision of other high risk pregnancies, unspecified trimester: Secondary | ICD-10-CM

## 2024-04-11 DIAGNOSIS — Z3480 Encounter for supervision of other normal pregnancy, unspecified trimester: Secondary | ICD-10-CM

## 2024-04-11 NOTE — Progress Notes (Signed)
   PRENATAL VISIT NOTE  Subjective:  Sophia Green is a 18 y.o. G2P1001 at [redacted]w[redacted]d being seen today for ongoing prenatal care.  She is currently monitored for the following issues for this low-risk pregnancy and has Supervision of other normal pregnancy, antepartum and Short interval between pregnancies affecting pregnancy, antepartum on their problem list.  Patient reports no complaints. Dr. Izell delivered her first baby and she had a really good experience, would like him to deliver again if possible. Contractions: Irritability. Vag. Bleeding: None.  Movement: Present. Denies leaking of fluid.   The following portions of the patient's history were reviewed and updated as appropriate: allergies, current medications, past family history, past medical history, past social history, past surgical history and problem list.   Objective:   Vitals:   04/11/24 1519  BP: 109/72  Pulse: 85  Weight: 152 lb (68.9 kg)    Fetal Status: Fetal Heart Rate (bpm): 151 Fundal Height: 30 cm Movement: Present     General:  Alert, oriented and cooperative. Patient is in no acute distress.  Skin: Skin is warm and dry. No rash noted.   Cardiovascular: Normal heart rate noted  Respiratory: Normal respiratory effort, no problems with respiration noted  Abdomen: Soft, gravid, appropriate for gestational age.  Pain/Pressure: Present      Assessment and Plan:  Pregnancy: G2P1001 at [redacted]w[redacted]d 1. Supervision of other normal pregnancy, antepartum (Primary) 2. [redacted] weeks gestation of pregnancy RSV vaccine discussed, she accepts for next visit Reviewed call schedule of [facility] and that it is unlikely Dr. Izell will be on call for her delivery but will put in sticky note just in case  3. Short interval between pregnancies affecting pregnancy, antepartum  Please refer to After Visit Summary for other counseling recommendations.   Return in about 2 weeks (around 04/25/2024) for return OB at 32 weeks.  Future  Appointments  Date Time Provider Department Center  04/26/2024  2:30 PM Rudy Carlin LABOR, MD [facility] None   Kieth JAYSON Carolin, MD

## 2024-04-26 ENCOUNTER — Ambulatory Visit: Admitting: Obstetrics

## 2024-04-26 ENCOUNTER — Encounter: Payer: Self-pay | Admitting: Obstetrics

## 2024-04-26 VITALS — BP 111/68 | HR 79 | Wt 148.6 lb

## 2024-04-26 DIAGNOSIS — Z3480 Encounter for supervision of other normal pregnancy, unspecified trimester: Secondary | ICD-10-CM

## 2024-04-26 DIAGNOSIS — O09899 Supervision of other high risk pregnancies, unspecified trimester: Secondary | ICD-10-CM

## 2024-04-26 DIAGNOSIS — Z3A32 32 weeks gestation of pregnancy: Secondary | ICD-10-CM

## 2024-04-26 DIAGNOSIS — O09893 Supervision of other high risk pregnancies, third trimester: Secondary | ICD-10-CM | POA: Diagnosis not present

## 2024-04-26 NOTE — Progress Notes (Signed)
 Subjective:  Sophia Green is a 18 y.o. G2P1001 at [redacted]w[redacted]d being seen today for ongoing prenatal care.  She is currently monitored for the following issues for this low-risk pregnancy and has Supervision of other normal pregnancy, antepartum and Short interval between pregnancies affecting pregnancy, antepartum on their problem list.  Patient reports no complaints.  Contractions: Not present. Vag. Bleeding: None.  Movement: Present. Denies leaking of fluid.   The following portions of the patient's history were reviewed and updated as appropriate: allergies, current medications, past family history, past medical history, past social history, past surgical history and problem list. Problem list updated.  Objective:   Vitals:   04/26/24 1505  BP: 111/68  Pulse: 79  Weight: 148 lb 9.6 oz (67.4 kg)    Fetal Status: Fetal Heart Rate (bpm): 130   Movement: Present     General:  Alert, oriented and cooperative. Patient is in no acute distress.  Skin: Skin is warm and dry. No rash noted.   Cardiovascular: Normal heart rate noted  Respiratory: Normal respiratory effort, no problems with respiration noted  Abdomen: Soft, gravid, appropriate for gestational age. Pain/Pressure: Absent     Pelvic:  Cervical exam deferred        Extremities: Normal range of motion.  Edema: None  Mental Status: Normal mood and affect. Normal behavior. Normal judgment and thought content.   Urinalysis:      Assessment and Plan:  Pregnancy: G2P1001 at [redacted]w[redacted]d  1. Supervision of other normal pregnancy, antepartum (Primary)  2. Short interval between pregnancies affecting pregnancy, antepartum   Preterm labor symptoms and general obstetric precautions including but not limited to vaginal bleeding, contractions, leaking of fluid and fetal movement were reviewed in detail with the patient. Please refer to After Visit Summary for other counseling recommendations.   Return in about 2 weeks (around 05/10/2024) for  ROB.   Rudy Carlin LABOR, MD 04/26/2024

## 2024-04-26 NOTE — Progress Notes (Signed)
 Pt presents for ROB visit. No concerns

## 2024-05-12 ENCOUNTER — Ambulatory Visit (INDEPENDENT_AMBULATORY_CARE_PROVIDER_SITE_OTHER): Payer: Self-pay | Admitting: Obstetrics and Gynecology

## 2024-05-12 VITALS — BP 114/71 | HR 81 | Wt 154.0 lb

## 2024-05-12 DIAGNOSIS — O09893 Supervision of other high risk pregnancies, third trimester: Secondary | ICD-10-CM

## 2024-05-12 DIAGNOSIS — Z2911 Encounter for prophylactic immunotherapy for respiratory syncytial virus (RSV): Secondary | ICD-10-CM | POA: Diagnosis not present

## 2024-05-12 DIAGNOSIS — Z3A35 35 weeks gestation of pregnancy: Secondary | ICD-10-CM | POA: Diagnosis not present

## 2024-05-12 DIAGNOSIS — Z23 Encounter for immunization: Secondary | ICD-10-CM | POA: Diagnosis not present

## 2024-05-12 DIAGNOSIS — Z3480 Encounter for supervision of other normal pregnancy, unspecified trimester: Secondary | ICD-10-CM

## 2024-05-12 DIAGNOSIS — O09899 Supervision of other high risk pregnancies, unspecified trimester: Secondary | ICD-10-CM

## 2024-05-12 NOTE — Progress Notes (Signed)
" ° °  PRENATAL VISIT NOTE  Subjective:  Sophia Green is a 19 y.o. G2P1001 at [redacted]w[redacted]d being seen today for ongoing prenatal care.  She is currently monitored for the following issues for this low-risk pregnancy and has Supervision of other normal pregnancy, antepartum and Short interval between pregnancies affecting pregnancy, antepartum on their problem list.  Patient reports no complaints.  Contractions: Not present. Vag. Bleeding: None.  Movement: Present. Denies leaking of fluid.   The following portions of the patient's history were reviewed and updated as appropriate: allergies, current medications, past family history, past medical history, past social history, past surgical history and problem list.   Objective:   Vitals:   05/12/24 1511  BP: 114/71  Pulse: 81  Weight: 154 lb (69.9 kg)    Fetal Status:  Fetal Heart Rate (bpm): 145 Fundal Height: 35 cm Movement: Present    General: Alert, oriented and cooperative. Patient is in no acute distress.  Skin: Skin is warm and dry. No rash noted.   Cardiovascular: Normal heart rate noted  Respiratory: Normal respiratory effort, no problems with respiration noted  Abdomen: Soft, gravid, appropriate for gestational age.  Pain/Pressure: Absent     Pelvic: Cervical exam deferred        Extremities: Normal range of motion.  Edema: None  Mental Status: Normal mood and affect. Normal behavior. Normal judgment and thought content.      03/24/2024    8:47 AM 12/03/2023   11:18 AM 11/17/2023    8:34 AM  Depression screen PHQ 2/9  Decreased Interest  1 0  Down, Depressed, Hopeless  0 0  PHQ - 2 Score  1 0  Altered sleeping 0 0 0  Tired, decreased energy 0 1 0  Change in appetite 0 1 0  Feeling bad or failure about yourself  0 0 0  Trouble concentrating 0 0 0  Moving slowly or fidgety/restless 0 0 0  Suicidal thoughts 0 0 0  PHQ-9 Score  3  0      Data saved with a previous flowsheet row definition        03/24/2024    8:47  AM 12/03/2023   11:18 AM 11/17/2023    8:35 AM 10/22/2022    8:23 AM  GAD 7 : Generalized Anxiety Score  Nervous, Anxious, on Edge 0 0 0 0  Control/stop worrying 0 1 0 0  Worry too much - different things 0 1 0 0  Trouble relaxing 0 0 0 0  Restless 0 0 0 0  Easily annoyed or irritable 0 0 0 0  Afraid - awful might happen 0 1 0 0  Total GAD 7 Score 0 3 0 0    Assessment and Plan:  Pregnancy: G2P1001 at [redacted]w[redacted]d 1. Supervision of other normal pregnancy, antepartum (Primary) Anticipatory guidance RSV and flu shot today GBS/STI swab next visit reviewed  2. Short interval between pregnancies affecting pregnancy, antepartum   Preterm labor symptoms and general obstetric precautions including but not limited to vaginal bleeding, contractions, leaking of fluid and fetal movement were reviewed in detail with the patient. Please refer to After Visit Summary for other counseling recommendations.   Return in about 1 week (around 05/19/2024).  No future appointments.  Rollo ONEIDA Bring, MD  "

## 2024-05-12 NOTE — Addendum Note (Signed)
 Addended by: Marciel Offenberger J on: 05/12/2024 03:30 PM   Modules accepted: Orders

## 2024-05-12 NOTE — Progress Notes (Addendum)
 ROB.  RSV and FLU Vaccines given in RD tolerated well.

## 2024-05-19 ENCOUNTER — Ambulatory Visit: Payer: Self-pay | Admitting: Obstetrics and Gynecology

## 2024-05-19 ENCOUNTER — Other Ambulatory Visit (HOSPITAL_COMMUNITY)
Admission: RE | Admit: 2024-05-19 | Discharge: 2024-05-19 | Disposition: A | Source: Ambulatory Visit | Attending: Obstetrics and Gynecology | Admitting: Obstetrics and Gynecology

## 2024-05-19 ENCOUNTER — Encounter: Payer: Self-pay | Admitting: Obstetrics and Gynecology

## 2024-05-19 VITALS — BP 114/75 | HR 86 | Wt 157.0 lb

## 2024-05-19 DIAGNOSIS — Z3480 Encounter for supervision of other normal pregnancy, unspecified trimester: Secondary | ICD-10-CM | POA: Diagnosis present

## 2024-05-19 DIAGNOSIS — Z3A36 36 weeks gestation of pregnancy: Secondary | ICD-10-CM | POA: Diagnosis not present

## 2024-05-19 DIAGNOSIS — O09893 Supervision of other high risk pregnancies, third trimester: Secondary | ICD-10-CM

## 2024-05-19 DIAGNOSIS — O09899 Supervision of other high risk pregnancies, unspecified trimester: Secondary | ICD-10-CM

## 2024-05-19 NOTE — Progress Notes (Signed)
 36 week swabs  Pt has no concerns today

## 2024-05-19 NOTE — Progress Notes (Signed)
" ° °  PRENATAL VISIT NOTE  Subjective:  Sophia Green is a 19 y.o. G2P1001 at [redacted]w[redacted]d being seen today for ongoing prenatal care.  She is currently monitored for the following issues for this low-risk pregnancy and has Supervision of other normal pregnancy, antepartum and Short interval between pregnancies affecting pregnancy, antepartum on their problem list.  Patient reports no complaints.  Contractions: Irritability. Vag. Bleeding: None.  Movement: Present. Denies leaking of fluid.   The following portions of the patient's history were reviewed and updated as appropriate: allergies, current medications, past family history, past medical history, past social history, past surgical history and problem list.   Objective:   Vitals:   05/19/24 1522  BP: 114/75  Pulse: 86  Weight: 157 lb (71.2 kg)    Fetal Status:  Fetal Heart Rate (bpm): 142 Fundal Height: 36 cm Movement: Present    General: Alert, oriented and cooperative. Patient is in no acute distress.  Skin: Skin is warm and dry. No rash noted.   Cardiovascular: Normal heart rate noted  Respiratory: Normal respiratory effort, no problems with respiration noted  Abdomen: Soft, gravid, appropriate for gestational age.  Pain/Pressure: Absent     Pelvic: Cervical exam deferred       cephalic by palpation  Extremities: Normal range of motion.  Edema: None  Mental Status: Normal mood and affect. Normal behavior. Normal judgment and thought content.      03/24/2024    8:47 AM 12/03/2023   11:18 AM 11/17/2023    8:34 AM  Depression screen PHQ 2/9  Decreased Interest  1 0  Down, Depressed, Hopeless  0 0  PHQ - 2 Score  1 0  Altered sleeping 0 0 0  Tired, decreased energy 0 1 0  Change in appetite 0 1 0  Feeling bad or failure about yourself  0 0 0  Trouble concentrating 0 0 0  Moving slowly or fidgety/restless 0 0 0  Suicidal thoughts 0 0 0  PHQ-9 Score  3  0      Data saved with a previous flowsheet row definition         03/24/2024    8:47 AM 12/03/2023   11:18 AM 11/17/2023    8:35 AM 10/22/2022    8:23 AM  GAD 7 : Generalized Anxiety Score  Nervous, Anxious, on Edge 0 0 0 0  Control/stop worrying 0 1 0 0  Worry too much - different things 0 1 0 0  Trouble relaxing 0 0 0 0  Restless 0 0 0 0  Easily annoyed or irritable 0 0 0 0  Afraid - awful might happen 0 1 0 0  Total GAD 7 Score 0 3 0 0    Assessment and Plan:  Pregnancy: G2P1001 at [redacted]w[redacted]d 1. Short interval between pregnancies affecting pregnancy, antepartum (Primary)  2. Supervision of other normal pregnancy, antepartum - Cervicovaginal ancillary only( [place]) - Culture, beta strep (group b only)  3. [redacted] weeks gestation of pregnancy   Preterm labor symptoms and general obstetric precautions including but not limited to vaginal bleeding, contractions, leaking of fluid and fetal movement were reviewed in detail with the patient. Please refer to After Visit Summary for other counseling recommendations.   Return in about 1 week (around 05/26/2024) for low OB.  No future appointments.  Burnard CHRISTELLA Moats, MD  "

## 2024-05-23 ENCOUNTER — Ambulatory Visit: Payer: Self-pay | Admitting: Obstetrics and Gynecology

## 2024-05-23 LAB — CERVICOVAGINAL ANCILLARY ONLY
Bacterial Vaginitis (gardnerella): POSITIVE — AB
Candida Glabrata: NEGATIVE
Candida Vaginitis: NEGATIVE
Chlamydia: NEGATIVE
Comment: NEGATIVE
Comment: NEGATIVE
Comment: NEGATIVE
Comment: NEGATIVE
Comment: NEGATIVE
Comment: NORMAL
Neisseria Gonorrhea: NEGATIVE
Trichomonas: NEGATIVE

## 2024-05-23 LAB — CULTURE, BETA STREP (GROUP B ONLY): Strep Gp B Culture: NEGATIVE

## 2024-05-24 MED ORDER — METRONIDAZOLE 500 MG PO TABS: 500.0000 mg | ORAL_TABLET | Freq: Two times a day (BID) | ORAL | 0 refills | Status: AC

## 2024-05-26 ENCOUNTER — Encounter: Payer: Self-pay | Admitting: Obstetrics

## 2024-05-26 ENCOUNTER — Ambulatory Visit (INDEPENDENT_AMBULATORY_CARE_PROVIDER_SITE_OTHER): Payer: Self-pay | Admitting: Obstetrics

## 2024-05-26 VITALS — BP 110/69 | HR 94 | Wt 157.4 lb

## 2024-05-26 DIAGNOSIS — Z3A37 37 weeks gestation of pregnancy: Secondary | ICD-10-CM

## 2024-05-26 DIAGNOSIS — Z3480 Encounter for supervision of other normal pregnancy, unspecified trimester: Secondary | ICD-10-CM

## 2024-05-26 DIAGNOSIS — O09893 Supervision of other high risk pregnancies, third trimester: Secondary | ICD-10-CM | POA: Diagnosis not present

## 2024-05-26 DIAGNOSIS — O09899 Supervision of other high risk pregnancies, unspecified trimester: Secondary | ICD-10-CM

## 2024-05-26 NOTE — Progress Notes (Signed)
 Subjective:  Sophia Green is a 19 y.o. G2P1001 at [redacted]w[redacted]d being seen today for ongoing prenatal care.  She is currently monitored for the following issues for this low-risk pregnancy and has Supervision of other normal pregnancy, antepartum and Short interval between pregnancies affecting pregnancy, antepartum on their problem list.  Patient reports no complaints.  Contractions: Irritability. Vag. Bleeding: None.  Movement: Present. Denies leaking of fluid.   The following portions of the patient's history were reviewed and updated as appropriate: allergies, current medications, past family history, past medical history, past social history, past surgical history and problem list. Problem list updated.  Objective:   Vitals:   05/26/24 1114  BP: 110/69  Pulse: 94  Weight: 157 lb 6.4 oz (71.4 kg)    Fetal Status: Fetal Heart Rate (bpm): 145   Movement: Present     General:  Alert, oriented and cooperative. Patient is in no acute distress.  Skin: Skin is warm and dry. No rash noted.   Cardiovascular: Normal heart rate noted  Respiratory: Normal respiratory effort, no problems with respiration noted  Abdomen: Soft, gravid, appropriate for gestational age. Pain/Pressure: Absent     Pelvic:  Cervical exam deferred        Extremities: Normal range of motion.  Edema: None  Mental Status: Normal mood and affect. Normal behavior. Normal judgment and thought content.   Urinalysis:      Assessment and Plan:  Pregnancy: G2P1001 at [redacted]w[redacted]d  1. Supervision of other normal pregnancy, antepartum (Primary)  2. Short interval between pregnancies affecting pregnancy, antepartum   Term labor symptoms and general obstetric precautions including but not limited to vaginal bleeding, contractions, leaking of fluid and fetal movement were reviewed in detail with the patient. Please refer to After Visit Summary for other counseling recommendations.   Return in about 1 week (around 06/02/2024) for  ROB.   Rudy Carlin LABOR, MD 05/26/2024

## 2024-05-26 NOTE — Progress Notes (Signed)
 Pt presents for rob. Pt has no questions or concerns at this time.

## 2024-06-02 ENCOUNTER — Encounter: Payer: Self-pay | Admitting: Obstetrics and Gynecology

## 2024-06-02 ENCOUNTER — Ambulatory Visit: Payer: Self-pay | Admitting: Obstetrics and Gynecology

## 2024-06-02 VITALS — BP 128/78 | HR 93 | Wt 161.0 lb

## 2024-06-02 DIAGNOSIS — O09893 Supervision of other high risk pregnancies, third trimester: Secondary | ICD-10-CM

## 2024-06-02 DIAGNOSIS — Z3A38 38 weeks gestation of pregnancy: Secondary | ICD-10-CM | POA: Diagnosis not present

## 2024-06-02 DIAGNOSIS — O09899 Supervision of other high risk pregnancies, unspecified trimester: Secondary | ICD-10-CM

## 2024-06-02 DIAGNOSIS — Z3480 Encounter for supervision of other normal pregnancy, unspecified trimester: Secondary | ICD-10-CM

## 2024-06-02 NOTE — Progress Notes (Signed)
 "  PRENATAL VISIT NOTE  Subjective:  Sophia Green is a 19 y.o. G2P1001 at [redacted]w[redacted]d being seen today for ongoing prenatal care.  She is currently monitored for the following issues for this low-risk pregnancy and has Supervision of other normal pregnancy, antepartum and Short interval between pregnancies affecting pregnancy, antepartum on their problem list.  Patient reports no complaints.  Contractions: Irregular.  .  Movement: Present. Denies leaking of fluid.   The following portions of the patient's history were reviewed and updated as appropriate: allergies, current medications, past family history, past medical history, past social history, past surgical history and problem list.   Objective:   Vitals:   06/02/24 1401  BP: 128/78  Pulse: 93  Weight: 161 lb (73 kg)    Fetal Status:  Fetal Heart Rate (bpm): 132   Movement: Present    General: Alert, oriented and cooperative. Patient is in no acute distress.  Skin: Skin is warm and dry. No rash noted.   Cardiovascular: Normal heart rate noted  Respiratory: Normal respiratory effort, no problems with respiration noted  Abdomen: Soft, gravid, appropriate for gestational age.  Pain/Pressure: Present (mostly in hips)     Pelvic: Cervical exam deferred        Extremities: Normal range of motion.  Edema: None  Mental Status: Normal mood and affect. Normal behavior. Normal judgment and thought content.      03/24/2024    8:47 AM 12/03/2023   11:18 AM 11/17/2023    8:34 AM  Depression screen PHQ 2/9  Decreased Interest  1 0  Down, Depressed, Hopeless  0 0  PHQ - 2 Score  1 0  Altered sleeping 0 0 0  Tired, decreased energy 0 1 0  Change in appetite 0 1 0  Feeling bad or failure about yourself  0 0 0  Trouble concentrating 0 0 0  Moving slowly or fidgety/restless 0 0 0  Suicidal thoughts 0 0 0  PHQ-9 Score  3  0      Data saved with a previous flowsheet row definition        03/24/2024    8:47 AM 12/03/2023   11:18  AM 11/17/2023    8:35 AM 10/22/2022    8:23 AM  GAD 7 : Generalized Anxiety Score  Nervous, Anxious, on Edge 0  0  0  0   Control/stop worrying 0  1  0  0   Worry too much - different things 0  1  0  0   Trouble relaxing 0  0  0  0   Restless 0  0  0  0   Easily annoyed or irritable 0  0  0  0   Afraid - awful might happen 0  1  0  0   Total GAD 7 Score 0 3 0 0     Data saved with a previous flowsheet row definition    Assessment and Plan:  Pregnancy: G2P1001 at [redacted]w[redacted]d 1. Supervision of other normal pregnancy, antepartum (Primary) Patient is doing well without complaints Reviewed negative GBS result   2. Short interval between pregnancies affecting pregnancy, antepartum   Term labor symptoms and general obstetric precautions including but not limited to vaginal bleeding, contractions, leaking of fluid and fetal movement were reviewed in detail with the patient. Please refer to After Visit Summary for other counseling recommendations.   Return in about 1 week (around 06/09/2024) for in person, ROB, Low risk.  No future appointments.  Winton Felt, MD  "

## 2024-06-08 ENCOUNTER — Ambulatory Visit: Payer: Self-pay | Admitting: Obstetrics and Gynecology

## 2024-06-08 ENCOUNTER — Encounter: Payer: Self-pay | Admitting: Obstetrics and Gynecology

## 2024-06-08 VITALS — BP 120/80 | HR 99 | Wt 164.2 lb

## 2024-06-08 DIAGNOSIS — Z3480 Encounter for supervision of other normal pregnancy, unspecified trimester: Secondary | ICD-10-CM

## 2024-06-08 DIAGNOSIS — Z3A38 38 weeks gestation of pregnancy: Secondary | ICD-10-CM

## 2024-06-08 NOTE — Progress Notes (Signed)
 Pt would like cervix check; 39 weeks tomorrow.  Pt has no other concerns today.

## 2024-06-08 NOTE — Progress Notes (Signed)
 "  PRENATAL VISIT NOTE  Subjective:  Sophia Green is a 19 y.o. G2P1001 at [redacted]w[redacted]d being seen today for ongoing prenatal care.  She is currently monitored for the following issues for this low-risk pregnancy and has Supervision of other normal pregnancy, antepartum and Short interval between pregnancies affecting pregnancy, antepartum on their problem list.  Patient reports occasional contractions.  Contractions: Irritability. Vag. Bleeding: None.  Movement: Present. Denies leaking of fluid.   The following portions of the patient's history were reviewed and updated as appropriate: allergies, current medications, past family history, past medical history, past social history, past surgical history and problem list.   Objective:   Vitals:   06/08/24 1439  BP: 120/80  Pulse: 99  Weight: 74.5 kg    Fetal Status:  Fetal Heart Rate (bpm): 133 Fundal Height: 39 cm Movement: Present    General: Alert, oriented and cooperative. Patient is in no acute distress.  Skin: Skin is warm and dry. No rash noted.   Cardiovascular: Normal heart rate noted  Respiratory: Normal respiratory effort, no problems with respiration noted  Abdomen: Soft, gravid, appropriate for gestational age.  Pain/Pressure: Present     Pelvic: Cervical exam performed in the presence of a chaperone Dilation: 3 Effacement (%): 50 Station: -2  Extremities: Normal range of motion.  Edema: None  Mental Status: Normal mood and affect. Normal behavior. Normal judgment and thought content.      03/24/2024    8:47 AM 12/03/2023   11:18 AM 11/17/2023    8:34 AM  Depression screen PHQ 2/9  Decreased Interest  1 0  Down, Depressed, Hopeless  0 0  PHQ - 2 Score  1 0  Altered sleeping 0 0 0  Tired, decreased energy 0 1 0  Change in appetite 0 1 0  Feeling bad or failure about yourself  0 0 0  Trouble concentrating 0 0 0  Moving slowly or fidgety/restless 0 0 0  Suicidal thoughts 0 0 0  PHQ-9 Score  3  0      Data saved  with a previous flowsheet row definition        03/24/2024    8:47 AM 12/03/2023   11:18 AM 11/17/2023    8:35 AM 10/22/2022    8:23 AM  GAD 7 : Generalized Anxiety Score  Nervous, Anxious, on Edge 0  0  0  0   Control/stop worrying 0  1  0  0   Worry too much - different things 0  1  0  0   Trouble relaxing 0  0  0  0   Restless 0  0  0  0   Easily annoyed or irritable 0  0  0  0   Afraid - awful might happen 0  1  0  0   Total GAD 7 Score 0 3 0 0     Data saved with a previous flowsheet row definition    Assessment and Plan:  Pregnancy: G2P1001 at [redacted]w[redacted]d 1. Supervision of other normal pregnancy, antepartum (Primary) - Cervical exam requested today. 3/50/-2  2. [redacted] weeks gestation of pregnancy - [place] good fetal movement - Labor signs discussed  Term labor symptoms and general obstetric precautions including but not limited to vaginal bleeding, contractions, leaking of fluid and fetal movement were reviewed in detail with the patient. Please refer to After Visit Summary for other counseling recommendations.   Return in about 1 week (around 06/15/2024) for OB VISIT (MD or  APP).  No future appointments.  Micha Erck VEAR Me, Student-MidWife  "

## 2024-06-15 ENCOUNTER — Encounter: Payer: Self-pay | Admitting: Obstetrics & Gynecology

## 2024-06-23 ENCOUNTER — Inpatient Hospital Stay (HOSPITAL_COMMUNITY): Admission: AD | Admit: 2024-06-23 | Source: Home / Self Care

## 2024-06-23 ENCOUNTER — Inpatient Hospital Stay (HOSPITAL_COMMUNITY)
